# Patient Record
Sex: Female | Born: 1975 | Hispanic: No | Marital: Married
Health system: Southern US, Community
[De-identification: ages and names within clinical notes are randomized; demographics above are authoritative.]

## PROBLEM LIST (undated history)

## (undated) DIAGNOSIS — E039 Hypothyroidism, unspecified: Secondary | ICD-10-CM

---

## 2002-01-03 ENCOUNTER — Other Ambulatory Visit: Admission: RE | Admit: 2002-01-03 | Discharge: 2002-01-03 | Payer: Self-pay | Admitting: Obstetrics and Gynecology

## 2010-12-14 DIAGNOSIS — E039 Hypothyroidism, unspecified: Secondary | ICD-10-CM | POA: Insufficient documentation

## 2011-07-11 DIAGNOSIS — D509 Iron deficiency anemia, unspecified: Secondary | ICD-10-CM | POA: Insufficient documentation

## 2012-06-05 ENCOUNTER — Inpatient Hospital Stay: Payer: Self-pay

## 2012-06-05 LAB — CBC WITH DIFFERENTIAL/PLATELET
Basophil #: 0 10*3/uL (ref 0.0–0.1)
Eosinophil #: 0 10*3/uL (ref 0.0–0.7)
Eosinophil %: 0.3 %
HCT: 35.4 % (ref 35.0–47.0)
Lymphocyte %: 17 %
MCH: 23.1 pg — ABNORMAL LOW (ref 26.0–34.0)
MCHC: 32.1 g/dL (ref 32.0–36.0)
Monocyte #: 0.7 x10 3/mm (ref 0.2–0.9)
Monocyte %: 8.4 %
Neutrophil #: 6 10*3/uL (ref 1.4–6.5)
Neutrophil %: 74.1 %
RBC: 4.91 10*6/uL (ref 3.80–5.20)
WBC: 8.1 10*3/uL (ref 3.6–11.0)

## 2012-06-06 LAB — HEMATOCRIT: HCT: 33.3 % — ABNORMAL LOW (ref 35.0–47.0)

## 2013-12-11 DIAGNOSIS — Z3183 Encounter for assisted reproductive fertility procedure cycle: Secondary | ICD-10-CM | POA: Insufficient documentation

## 2014-09-10 NOTE — H&P (Signed)
L&D Evaluation:  History:   HPI 39 yo G1P0 with LMP of 09/06/11 & EDd [facility] 06/12/12 [facility] for hypothyroidism tx with Levothyroxine , IVF and +1st trimester screen cleared by Targeted US. Pt has SROM at 0400am for clear fluid. Pt is now contracting and in very early labor. gBs neg.    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  HEENT, CNS, GI, GU, Respiratory, CV, Renal and Musculoskeletal systems were found to be normal.   Exam:   Vital Signs stable    General no apparent distress    Mental Status clear    Chest clear    Heart normal sinus rhythm, no murmur/gallop/rubs    Abdomen gravid, non-tender    Estimated Fetal Weight Average for gestational age    Back no CVAT    Edema 1+    Reflexes 1+    Clonus negative    Pelvic 2cms    Mebranes Ruptured    FHT normal rate with no decels    Ucx regular    Skin dry    Lymph no lymphadenopathy   Impression:   Impression early labor, IUP at term with early labor   Plan:   Plan monitor contractions and for cervical change   Electronic Signatures: Sharee PimpleJones, Doninique Lwin W (CNM)  (Signed 03-Feb-14 09:10)  Authored: L&D Evaluation   Last Updated: 03-Feb-14 09:10 by Sharee PimpleJones, Asheton Viramontes W (CNM)

## 2015-04-23 ENCOUNTER — Other Ambulatory Visit: Payer: Self-pay | Admitting: Family Medicine

## 2015-04-23 DIAGNOSIS — E041 Nontoxic single thyroid nodule: Secondary | ICD-10-CM

## 2015-04-29 ENCOUNTER — Ambulatory Visit
Admission: RE | Admit: 2015-04-29 | Discharge: 2015-04-29 | Disposition: A | Discharge status: Home / Self Care | Payer: BLUE CROSS/BLUE SHIELD | Source: Ambulatory Visit | Attending: Family Medicine | Admitting: Family Medicine

## 2015-04-29 ENCOUNTER — Ambulatory Visit: Payer: Self-pay

## 2015-04-29 DIAGNOSIS — E041 Nontoxic single thyroid nodule: Secondary | ICD-10-CM | POA: Insufficient documentation

## 2017-07-11 DIAGNOSIS — R739 Hyperglycemia, unspecified: Secondary | ICD-10-CM | POA: Insufficient documentation

## 2017-07-12 DIAGNOSIS — H04123 Dry eye syndrome of bilateral lacrimal glands: Secondary | ICD-10-CM | POA: Insufficient documentation

## 2017-07-20 ENCOUNTER — Other Ambulatory Visit: Payer: Self-pay | Admitting: Family Medicine

## 2017-07-20 DIAGNOSIS — E041 Nontoxic single thyroid nodule: Secondary | ICD-10-CM

## 2017-07-20 DIAGNOSIS — Z0000 Encounter for general adult medical examination without abnormal findings: Secondary | ICD-10-CM

## 2017-07-26 ENCOUNTER — Encounter (INDEPENDENT_AMBULATORY_CARE_PROVIDER_SITE_OTHER): Payer: Self-pay

## 2017-07-26 ENCOUNTER — Other Ambulatory Visit: Payer: Self-pay | Admitting: Family Medicine

## 2017-07-26 ENCOUNTER — Ambulatory Visit
Admission: RE | Admit: 2017-07-26 | Discharge: 2017-07-26 | Disposition: A | Discharge status: Home / Self Care | Payer: 59 | Source: Ambulatory Visit | Attending: Family Medicine | Admitting: Family Medicine

## 2017-07-26 DIAGNOSIS — E041 Nontoxic single thyroid nodule: Secondary | ICD-10-CM | POA: Insufficient documentation

## 2017-07-26 DIAGNOSIS — Z0000 Encounter for general adult medical examination without abnormal findings: Secondary | ICD-10-CM | POA: Insufficient documentation

## 2017-07-28 DIAGNOSIS — Z8639 Personal history of other endocrine, nutritional and metabolic disease: Secondary | ICD-10-CM | POA: Insufficient documentation

## 2017-08-16 DIAGNOSIS — L700 Acne vulgaris: Secondary | ICD-10-CM | POA: Insufficient documentation

## 2019-11-06 ENCOUNTER — Other Ambulatory Visit: Payer: Self-pay | Admitting: Family Medicine

## 2019-11-06 DIAGNOSIS — Z1231 Encounter for screening mammogram for malignant neoplasm of breast: Secondary | ICD-10-CM

## 2019-11-12 ENCOUNTER — Ambulatory Visit
Admission: RE | Admit: 2019-11-12 | Discharge: 2019-11-12 | Disposition: A | Discharge status: Home / Self Care | Payer: 59 | Source: Ambulatory Visit | Attending: Family Medicine | Admitting: Family Medicine

## 2019-11-12 ENCOUNTER — Other Ambulatory Visit: Payer: Self-pay

## 2019-11-12 DIAGNOSIS — Z1231 Encounter for screening mammogram for malignant neoplasm of breast: Secondary | ICD-10-CM | POA: Insufficient documentation

## 2020-11-14 ENCOUNTER — Other Ambulatory Visit: Payer: Self-pay | Admitting: Family Medicine

## 2020-11-14 DIAGNOSIS — Z1231 Encounter for screening mammogram for malignant neoplasm of breast: Secondary | ICD-10-CM

## 2020-11-25 ENCOUNTER — Other Ambulatory Visit: Payer: Self-pay

## 2020-11-25 ENCOUNTER — Ambulatory Visit
Admission: RE | Admit: 2020-11-25 | Discharge: 2020-11-25 | Disposition: A | Discharge status: Home / Self Care | Payer: 59 | Source: Ambulatory Visit | Attending: Family Medicine | Admitting: Family Medicine

## 2020-11-25 DIAGNOSIS — Z1231 Encounter for screening mammogram for malignant neoplasm of breast: Secondary | ICD-10-CM | POA: Diagnosis present

## 2021-12-02 ENCOUNTER — Other Ambulatory Visit: Payer: Self-pay | Admitting: Family Medicine

## 2021-12-02 DIAGNOSIS — Z1231 Encounter for screening mammogram for malignant neoplasm of breast: Secondary | ICD-10-CM

## 2021-12-22 ENCOUNTER — Ambulatory Visit
Admission: RE | Admit: 2021-12-22 | Discharge: 2021-12-22 | Disposition: A | Discharge status: Home / Self Care | Payer: Managed Care, Other (non HMO) | Source: Ambulatory Visit | Attending: Family Medicine | Admitting: Family Medicine

## 2021-12-22 DIAGNOSIS — Z1231 Encounter for screening mammogram for malignant neoplasm of breast: Secondary | ICD-10-CM | POA: Insufficient documentation

## 2022-11-21 IMAGING — MG MM DIGITAL SCREENING BILAT W/ TOMO AND CAD
8 series · 9 of 24 positions shown · non-contrast
Comparison: Previous exam(s).

CLINICAL DATA: Screening.

EXAM:
DIGITAL SCREENING BILATERAL MAMMOGRAM WITH TOMOSYNTHESIS AND CAD
TECHNIQUE: Bilateral screening digital craniocaudal and mediolateral oblique
mammograms were obtained. Bilateral screening digital breast
tomosynthesis was performed. The images were evaluated with
computer-aided detection.

[R MLO synth-2D]
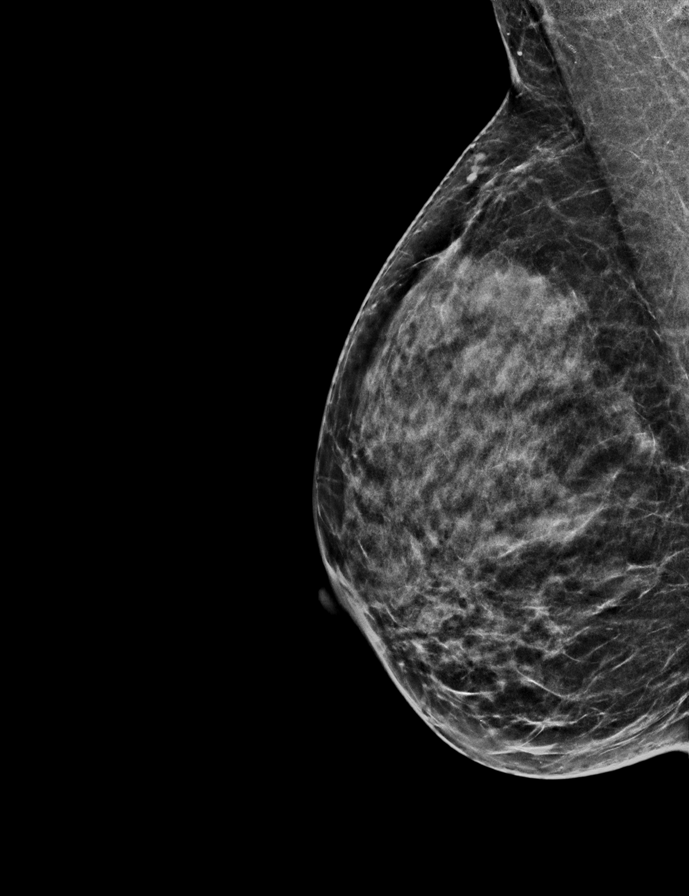

[L MLO synth-2D]
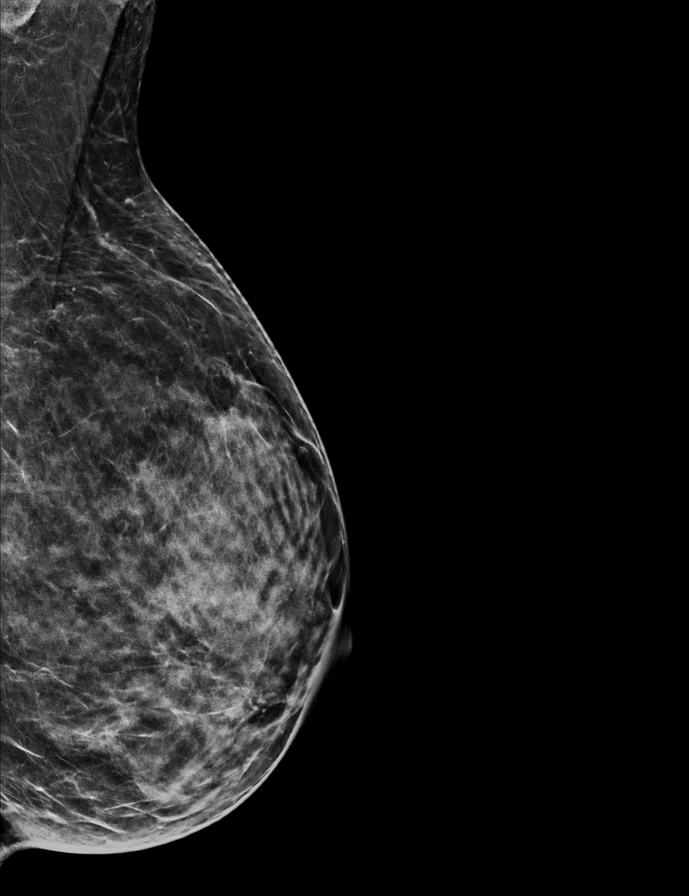

[L CC synth-2D]
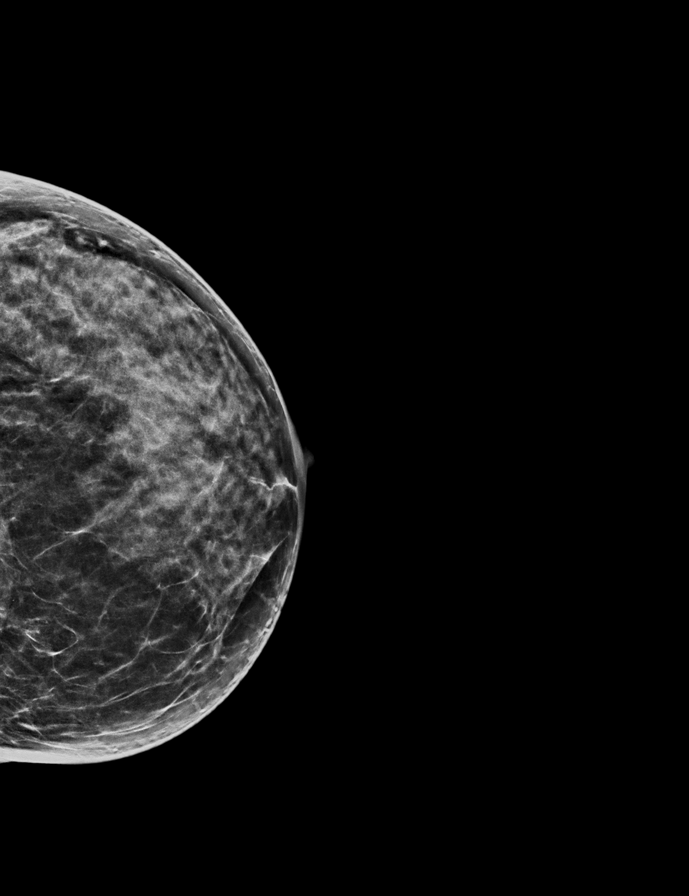

[R CC synth-2D]
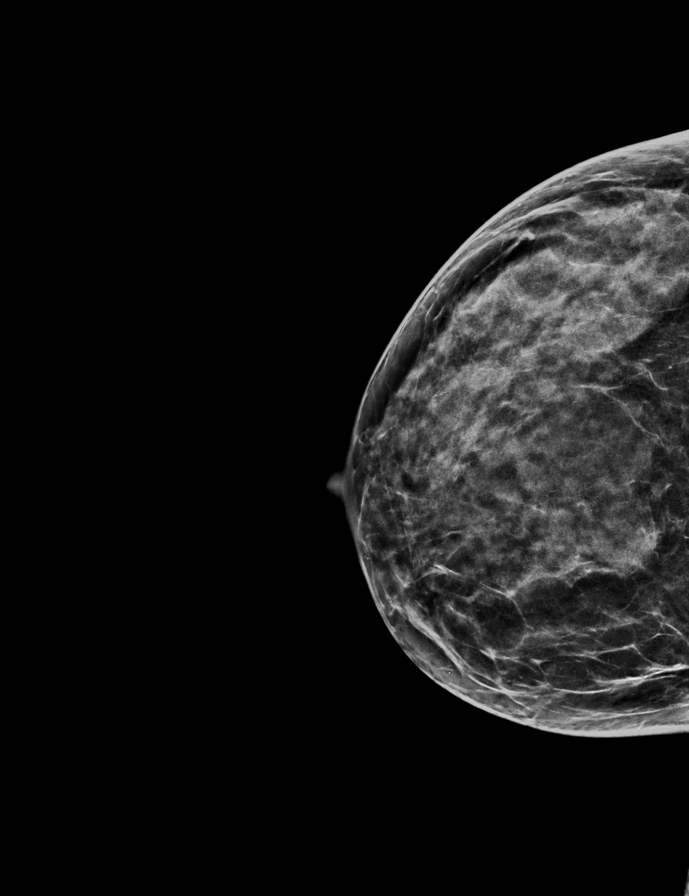

[R CC tomo · 2 of 56 frames shown]
[frame 19/56]
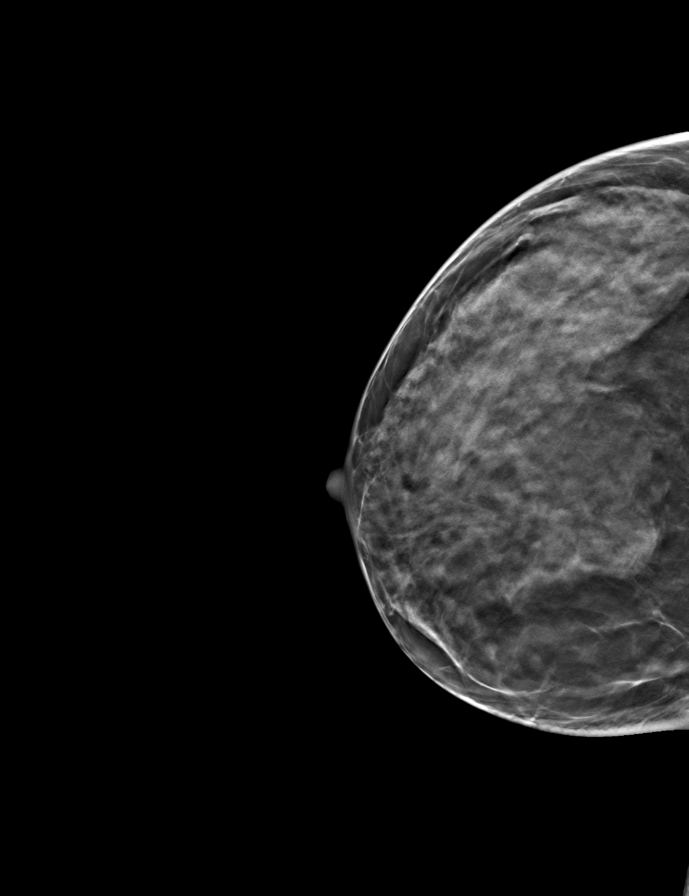
[frame 29/56]
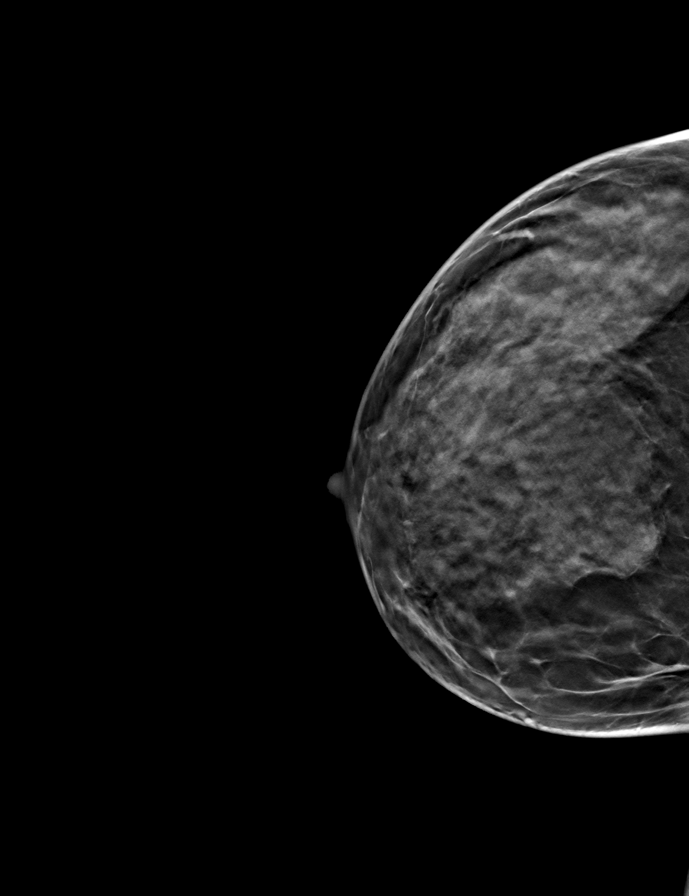

[R MLO tomo · tomo slice 26/51.0]
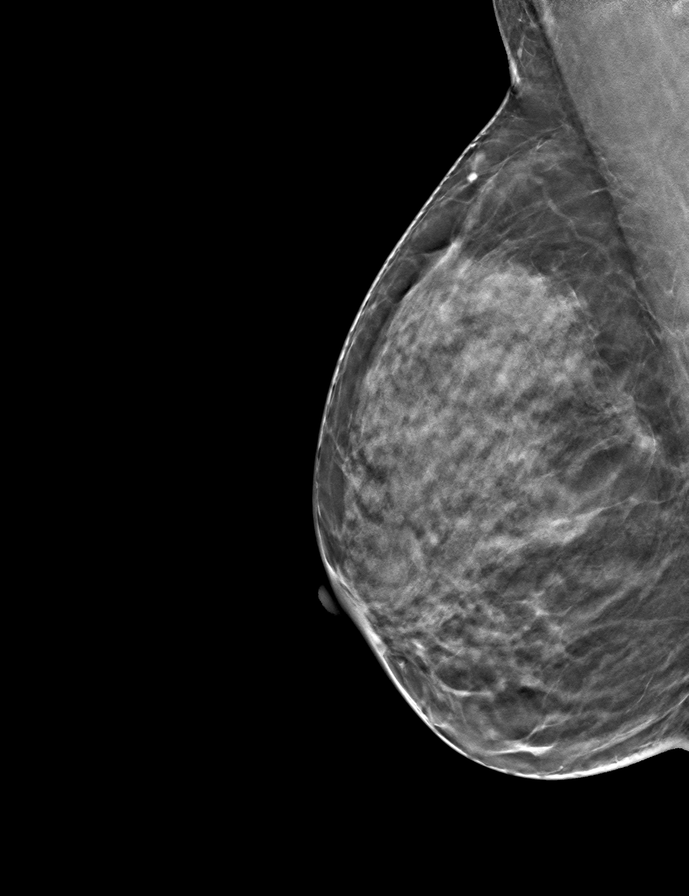

[L CC tomo · tomo slice 29/57.0]
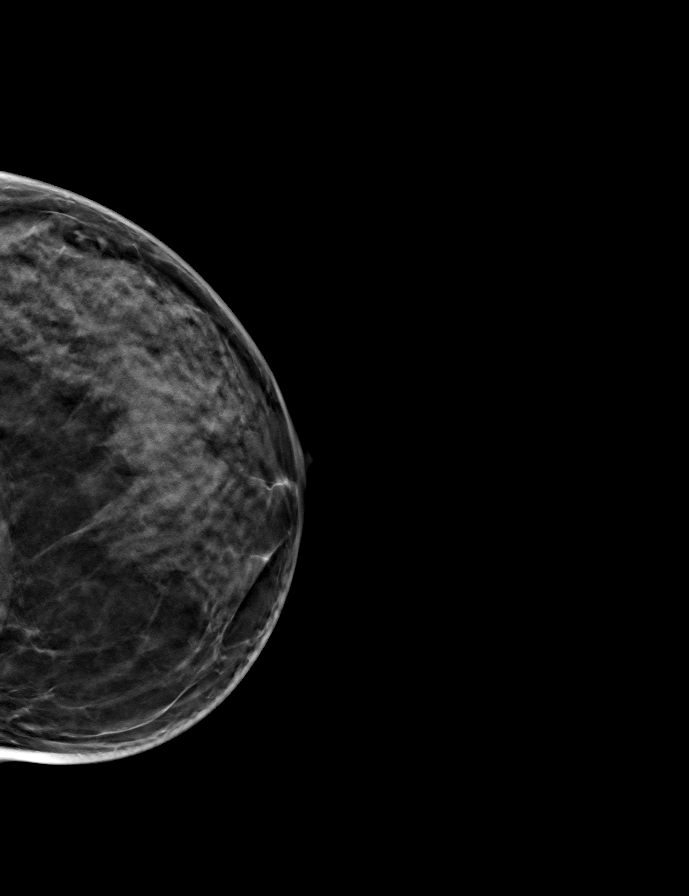

[L MLO tomo · tomo slice 27/54.0]
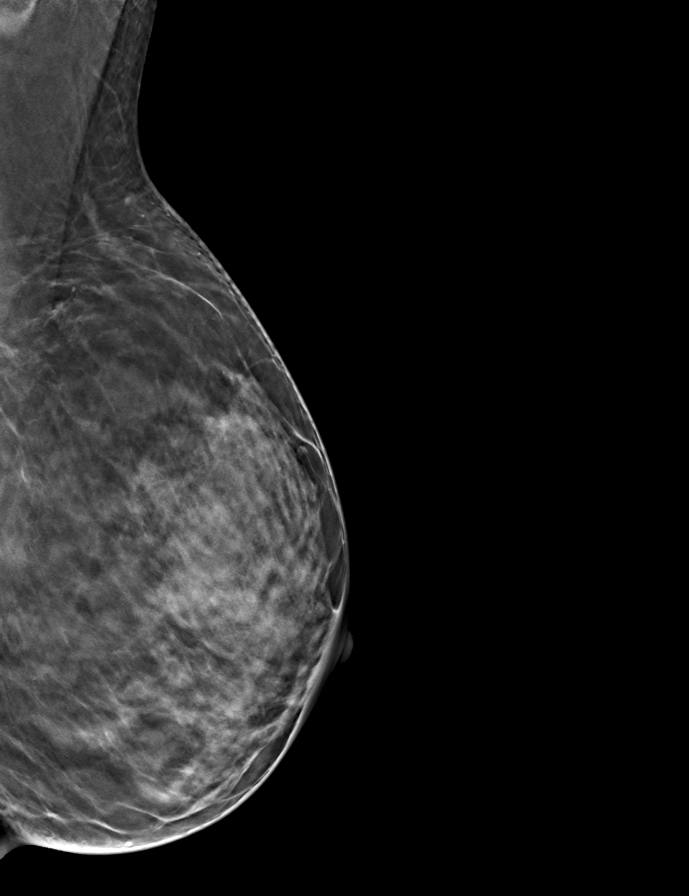

[9 of 24 positions shown; findings below may reference images not displayed]

ACR Breast Density Category d: The breast tissue is extremely dense,
which lowers the sensitivity of mammography
FINDINGS: There are no findings suspicious for malignancy.
IMPRESSION: No mammographic evidence of malignancy. A result letter of this
screening mammogram will be mailed directly to the patient.

RECOMMENDATION:
Screening mammogram in one year. (Code:[id number])

BI-RADS CATEGORY  1: Negative.

## 2022-12-07 ENCOUNTER — Other Ambulatory Visit: Payer: Self-pay | Admitting: Family Medicine

## 2022-12-07 DIAGNOSIS — Z1231 Encounter for screening mammogram for malignant neoplasm of breast: Secondary | ICD-10-CM

## 2022-12-30 ENCOUNTER — Ambulatory Visit
Admission: RE | Admit: 2022-12-30 | Discharge: 2022-12-30 | Disposition: A | Discharge status: Home / Self Care | Payer: 59 | Source: Ambulatory Visit | Attending: Family Medicine | Admitting: Family Medicine

## 2022-12-30 DIAGNOSIS — Z1231 Encounter for screening mammogram for malignant neoplasm of breast: Secondary | ICD-10-CM | POA: Diagnosis not present

## 2023-01-04 ENCOUNTER — Telehealth: Payer: Self-pay

## 2023-01-04 NOTE — Telephone Encounter (Signed)
Pt called to schedule her colonoscopy. I have contacted her PCP, Dr. Gavin Potters to send over the referral. Once received, I will put it in for you.

## 2023-01-07 NOTE — Telephone Encounter (Signed)
Contacted patient to let her know that we have still not received her referral.  She said she will call to the office.  Informed her that once we receive it we will give her a call back to schedule.  Thanks,  [place], [place]

## 2023-03-28 ENCOUNTER — Telehealth: Payer: Self-pay

## 2023-03-28 ENCOUNTER — Other Ambulatory Visit: Payer: Self-pay

## 2023-03-28 DIAGNOSIS — Z1211 Encounter for screening for malignant neoplasm of colon: Secondary | ICD-10-CM

## 2023-03-28 MED ORDER — PEG 3350-KCL-NA BICARB-NACL 420 G PO SOLR: 4000.0000 mL | Freq: Once | ORAL | 0 refills | Status: AC

## 2023-03-28 NOTE — Telephone Encounter (Signed)
Gastroenterology Pre-Procedure Review  Request Date: 05/03/23 Requesting Physician: Dr. Servando Snare  PATIENT REVIEW QUESTIONS: The patient responded to the following health history questions as indicated:    1. Are you having any GI issues? no 2. Do you have a personal history of Polyps? no 3. Do you have a family history of Colon Cancer or Polyps? no 4. Diabetes Mellitus? no 5. Joint replacements in the past 12 months?no 6. Major health problems in the past 3 months?no 7. Any artificial heart valves, MVP, or defibrillator?no    MEDICATIONS & ALLERGIES:    Patient reports the following regarding taking any anticoagulation/antiplatelet therapy:   Plavix, Coumadin, Eliquis, Xarelto, Lovenox, Pradaxa, Brilinta, or Effient? no Aspirin? no  Patient confirms/reports the following medications:  Current Outpatient Medications  Medication Sig Dispense Refill   polyethylene glycol-electrolytes (NULYTELY) 420 g solution Take 4,000 mLs by mouth once for 1 dose. 4000 mL 0   levothyroxine (SYNTHROID) 50 MCG tablet PLEASE SEE ATTACHED FOR DETAILED DIRECTIONS     No current facility-administered medications for this visit.    Patient confirms/reports the following allergies:  No Known Allergies  No orders of the defined types were placed in this encounter.   AUTHORIZATION INFORMATION Primary Insurance: 1D#: Group #:  Secondary Insurance: 1D#: Group #:  SCHEDULE INFORMATION: Date: 05/03/23 Time: Location: [facility]

## 2023-04-21 ENCOUNTER — Encounter: Payer: Self-pay | Admitting: Gastroenterology

## 2023-05-02 ENCOUNTER — Encounter: Payer: Self-pay | Admitting: Gastroenterology

## 2023-05-03 ENCOUNTER — Encounter
Admission: RE | Disposition: A | Discharge status: Home / Self Care | Payer: Self-pay | Source: Home / Self Care | Attending: Gastroenterology

## 2023-05-03 ENCOUNTER — Ambulatory Visit
Admission: RE | Admit: 2023-05-03 | Discharge: 2023-05-03 | Disposition: A | Discharge status: Home / Self Care | Payer: 59 | Attending: Gastroenterology | Admitting: Gastroenterology

## 2023-05-03 ENCOUNTER — Ambulatory Visit: Payer: 59 | Admitting: Anesthesiology

## 2023-05-03 ENCOUNTER — Encounter: Payer: Self-pay | Admitting: Gastroenterology

## 2023-05-03 DIAGNOSIS — K635 Polyp of colon: Secondary | ICD-10-CM

## 2023-05-03 DIAGNOSIS — K6389 Other specified diseases of intestine: Secondary | ICD-10-CM | POA: Diagnosis not present

## 2023-05-03 DIAGNOSIS — Z1211 Encounter for screening for malignant neoplasm of colon: Secondary | ICD-10-CM

## 2023-05-03 HISTORY — DX: Hypothyroidism, unspecified: E03.9

## 2023-05-03 HISTORY — PX: COLONOSCOPY WITH PROPOFOL: SHX5780

## 2023-05-03 HISTORY — PX: POLYPECTOMY: SHX5525

## 2023-05-03 SURGERY — COLONOSCOPY WITH PROPOFOL
Anesthesia: General

## 2023-05-03 MED ORDER — PROPOFOL 10 MG/ML IV BOLUS
INTRAVENOUS | Status: DC | PRN
Start: 2023-05-03 — End: 2023-05-03
  Administered 2023-05-03: 70 mg via INTRAVENOUS

## 2023-05-03 MED ORDER — LIDOCAINE HCL (PF) 2 % IJ SOLN
INTRAMUSCULAR | Status: AC
  Filled 2023-05-03: qty 5

## 2023-05-03 MED ORDER — LIDOCAINE 2% (20 MG/ML) 5 ML SYRINGE
INTRAMUSCULAR | Status: DC | PRN
  Administered 2023-05-03: 50 mg via INTRAVENOUS

## 2023-05-03 MED ORDER — PROPOFOL 500 MG/50ML IV EMUL
INTRAVENOUS | Status: DC | PRN
  Administered 2023-05-03: 150 ug/kg/min via INTRAVENOUS

## 2023-05-03 MED ORDER — SODIUM CHLORIDE 0.9 % IV SOLN: INTRAVENOUS | Status: DC

## 2023-05-03 NOTE — H&P (Signed)
   Rogelia Copping, MD [facility] [address] [place], [place] [address] Phone: [phone] Fax : [phone]  Primary Care Physician:  Rojelio Loader, MD Primary Gastroenterologist:  Dr. Copping  Pre-Procedure History & Physical: HPI:  NYRAH DEMOS is a 47 y.o. female is here for a screening colonoscopy.   Past Medical History:  Diagnosis Date   Hypothyroid     History reviewed. No pertinent surgical history.  Prior to Admission medications   Medication Sig Start Date End Date Taking? Authorizing Provider  ferrous sulfate 325 (65 FE) MG tablet Take 650 mg by mouth daily with breakfast.   Yes [provider]  levothyroxine (SYNTHROID) 50 MCG tablet PLEASE SEE ATTACHED FOR DETAILED DIRECTIONS   Yes [provider]    Allergies as of 03/28/2023   (No Known Allergies)    Family History  Problem Relation Age of Onset   Breast cancer Neg Hx     Social History   Socioeconomic History   Marital status: Married    Spouse name: Not on file   Number of children: Not on file   Years of education: Not on file   Highest education level: Not on file  Occupational History   Not on file  Tobacco Use   Smoking status: Never   Smokeless tobacco: Never  Vaping Use   Vaping status: Never Used  Substance and Sexual Activity   Alcohol use: Never   Drug use: Never   Sexual activity: Not on file  Other Topics Concern   Not on file  Social History Narrative   Not on file   Social Drivers of Health   Financial Resource Strain: Low Risk  (12/06/2022)   Received from [facility]   Overall Financial Resource Strain (CARDIA)    Difficulty of Paying Living Expenses: Not hard at all  Food Insecurity: No Food Insecurity (12/06/2022)   Received from [facility]   Hunger Vital Sign    Worried About Running Out of Food in the Last Year: Never true    Ran Out of Food in the Last Year: Never true  Transportation Needs: No  Transportation Needs (12/06/2022)   Received from [facility] - Transportation    In the past 12 months, has lack of transportation kept you from medical appointments or from getting medications?: No    Lack of Transportation (Non-Medical): No  Physical Activity: Not on file  Stress: Not on file  Social Connections: Not on file  Intimate Partner Violence: Not on file    Review of Systems: See HPI, otherwise negative ROS  Physical Exam: BP 102/75   Pulse (!) 57   Temp (!) 96.6 F (35.9 C) (Temporal)   Resp 16   Ht 5' 5 (1.651 m)   Wt 52.9 kg   SpO2 100%   BMI 19.40 kg/m  General:   Alert,  pleasant and cooperative in NAD Head:  Normocephalic and atraumatic. Neck:  Supple; no masses or thyromegaly. Lungs:  Clear throughout to auscultation.    Heart:  Regular rate and rhythm. Abdomen:  Soft, nontender and nondistended. Normal bowel sounds, without guarding, and without rebound.   Neurologic:  Alert and  oriented x4;  grossly normal neurologically.  Impression/Plan: MADDALYNN BARNARD is now here to undergo a screening colonoscopy.  Risks, benefits, and alternatives regarding colonoscopy have been reviewed with the patient.  Questions have been answered.  All parties agreeable.

## 2023-05-03 NOTE — Transfer of Care (Signed)
 Immediate Anesthesia Transfer of Care Note  Patient: Cheryl Gardner  Procedure(s) Performed: COLONOSCOPY WITH PROPOFOL  POLYPECTOMY  Patient Location: Endoscopy Unit  Anesthesia Type:General  Level of Consciousness: drowsy  Airway & Oxygen Therapy: Patient Spontanous Breathing  Post-op Assessment: Report given to RN and Post -op Vital signs reviewed and stable  Post vital signs: Reviewed and stable  Last Vitals:  Vitals Value Taken Time  BP    Temp    Pulse    Resp    SpO2      Last Pain:  Vitals:   05/03/23 0845  TempSrc: Temporal  PainSc: 0-No pain         Complications: No notable events documented.

## 2023-05-03 NOTE — Anesthesia Postprocedure Evaluation (Signed)
 Anesthesia Post Note  Patient: OPHA MCGHEE  Procedure(s) Performed: COLONOSCOPY WITH PROPOFOL  POLYPECTOMY  Patient location during evaluation: Endoscopy Anesthesia Type: General Level of consciousness: awake and alert Pain management: pain level controlled Vital Signs Assessment: post-procedure vital signs reviewed and stable Respiratory status: spontaneous breathing, nonlabored ventilation and respiratory function stable Cardiovascular status: blood pressure returned to baseline and stable Postop Assessment: no apparent nausea or vomiting Anesthetic complications: no   No notable events documented.   Last Vitals:  Vitals:   05/03/23 0958 05/03/23 1011  BP: 93/62 101/71  Pulse: 62 (!) 58  Resp: 16 20  Temp:    SpO2: 100% 100%    Last Pain:  Vitals:   05/03/23 1011  TempSrc:   PainSc: 0-No pain                 Camellia Merilee Louder

## 2023-05-03 NOTE — Anesthesia Preprocedure Evaluation (Addendum)
 Anesthesia Evaluation  Patient identified by MRN, date of birth, ID band Patient awake    Reviewed: Allergy & Precautions, H&P , NPO status , Patient's Chart, lab work & pertinent test results  Airway Mallampati: II  TM Distance: >3 FB Neck ROM: full    Dental no notable dental hx.    Pulmonary neg pulmonary ROS   Pulmonary exam normal        Cardiovascular negative cardio ROS Normal cardiovascular exam     Neuro/Psych negative neurological ROS  negative psych ROS   GI/Hepatic negative GI ROS, Neg liver ROS,,,  Endo/Other  Hypothyroidism    Renal/GU negative Renal ROS  negative genitourinary   Musculoskeletal   Abdominal Normal abdominal exam  (+)   Peds  Hematology negative hematology ROS (+)   Anesthesia Other Findings Past Medical History: No date: Hypothyroid  BMI    Body Mass Index: 19.40 kg/m      Reproductive/Obstetrics negative OB ROS                             Anesthesia Physical Anesthesia Plan  ASA: 1  Anesthesia Plan: General   Post-op Pain Management:    Induction:   PONV Risk Score and Plan: Propofol  infusion and TIVA  Airway Management Planned: Natural Airway  Additional Equipment:   Intra-op Plan:   Post-operative Plan:   Informed Consent: I have reviewed the patients History and Physical, chart, labs and discussed the procedure including the risks, benefits and alternatives for the proposed anesthesia with the patient or authorized representative who has indicated his/her understanding and acceptance.     Dental Advisory Given  Plan Discussed with: CRNA and Surgeon  Anesthesia Plan Comments:         Anesthesia Quick Evaluation

## 2023-05-03 NOTE — Op Note (Signed)
 [facility] Gastroenterology Patient Name: Cheryl Gardner Procedure Date: 05/03/2023 8:59 AM MRN: [id number] Account #: [id number] Date of Birth: 1975/07/21 Admit Type: Outpatient Age: 47 Room: [facility] ENDO ROOM 4 Gender: Female Note Status: Finalized Instrument Name: Arvis [id number] Procedure:             Colonoscopy Indications:           Screening for colorectal malignant neoplasm Providers:             Rogelia Copping MD, MD Referring MD:          Ike EMERSON Lavender MD, MD (Referring MD) Medicines:             Propofol  per Anesthesia Complications:         No immediate complications. Procedure:             Pre-Anesthesia Assessment:                        - Prior to the procedure, a History and Physical was                         performed, and patient medications and allergies were                         reviewed. The patient's tolerance of previous                         anesthesia was also reviewed. The risks and benefits                         of the procedure and the sedation options and risks                         were discussed with the patient. All questions were                         answered, and informed consent was obtained. Prior                         Anticoagulants: The patient has taken no anticoagulant                         or antiplatelet agents. ASA Grade Assessment: II - A                         patient with mild systemic disease. After reviewing                         the risks and benefits, the patient was deemed in                         satisfactory condition to undergo the procedure.                        After obtaining informed consent, the colonoscope was                         passed under direct vision. Throughout the procedure,  the patient's blood pressure, pulse, and oxygen                         saturations were monitored continuously. The                         Colonoscope was introduced  through the anus and                         advanced to the the cecum, identified by appendiceal                         orifice and ileocecal valve. The colonoscopy was                         performed without difficulty. The patient tolerated                         the procedure well. The quality of the bowel                         preparation was excellent. Findings:      The perianal and digital rectal examinations were normal.      A 2 mm polyp was found in the appendiceal orifice. The polyp was       sessile. The polyp was removed with a cold biopsy forceps. Resection and       retrieval were complete.      A medium polyp was found in the ileocecal valve. The polyp was       carpet-like. The polyp was removed with a cold snare. Polyp resection       was incomplete. The resected tissue was retrieved. Impression:            - One 2 mm polyp at the appendiceal orifice, removed                         with a cold biopsy forceps. Resected and retrieved.                        - One medium polyp at the ileocecal valve, removed                         with a cold snare. Incomplete resection. Resected                         tissue retrieved. Recommendation:        - Discharge patient to home.                        - Resume previous diet.                        - Continue present medications.                        - Await pathology results.                        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 All rights reserved. The codes documented in this report are preliminary and upon coder review may  be revised to meet current compliance requirements. Rogelia Copping MD, MD 05/03/2023 9:46:30 AM This report has been signed electronically. Number of Addenda: 0 Note Initiated On: 05/03/2023 8:59 AM Scope Withdrawal Time: 0 hours 9 minutes 58 seconds  Total Procedure Duration: 0 hours 19 minutes 53 seconds  Estimated Blood Loss:  Estimated blood loss: none.      [facility]

## 2023-05-05 ENCOUNTER — Encounter: Payer: Self-pay | Admitting: Gastroenterology

## 2023-05-10 LAB — SURGICAL PATHOLOGY

## 2023-05-12 ENCOUNTER — Encounter: Payer: Self-pay | Admitting: Gastroenterology

## 2023-12-07 ENCOUNTER — Other Ambulatory Visit: Payer: Self-pay | Admitting: Family Medicine

## 2023-12-07 DIAGNOSIS — Z1231 Encounter for screening mammogram for malignant neoplasm of breast: Secondary | ICD-10-CM

## 2024-01-11 ENCOUNTER — Ambulatory Visit

## 2024-01-19 ENCOUNTER — Ambulatory Visit
Admission: RE | Admit: 2024-01-19 | Discharge: 2024-01-19 | Disposition: A | Discharge status: Home / Self Care | Source: Ambulatory Visit | Attending: Family Medicine | Admitting: Family Medicine

## 2024-01-19 DIAGNOSIS — Z1231 Encounter for screening mammogram for malignant neoplasm of breast: Secondary | ICD-10-CM | POA: Diagnosis present

## 2024-01-23 ENCOUNTER — Encounter: Payer: Self-pay | Admitting: Family Medicine

## 2024-01-24 ENCOUNTER — Other Ambulatory Visit: Payer: Self-pay | Admitting: Family Medicine

## 2024-01-24 ENCOUNTER — Encounter: Payer: Self-pay | Admitting: Internal Medicine

## 2024-01-24 DIAGNOSIS — R928 Other abnormal and inconclusive findings on diagnostic imaging of breast: Secondary | ICD-10-CM

## 2024-01-26 ENCOUNTER — Ambulatory Visit
Admission: RE | Admit: 2024-01-26 | Discharge: 2024-01-26 | Disposition: A | Discharge status: Home / Self Care | Source: Ambulatory Visit | Attending: Family Medicine | Admitting: Family Medicine

## 2024-01-26 DIAGNOSIS — R928 Other abnormal and inconclusive findings on diagnostic imaging of breast: Secondary | ICD-10-CM | POA: Diagnosis present
